# Patient Record
Sex: Male | Born: 1990 | Race: White | Hispanic: No | Marital: Single | Smoking: Never smoker
Health system: Southern US, Community
[De-identification: ages and names within clinical notes are randomized; demographics above are authoritative.]

## PROBLEM LIST (undated history)

## (undated) DIAGNOSIS — F329 Major depressive disorder, single episode, unspecified: Secondary | ICD-10-CM

## (undated) DIAGNOSIS — F419 Anxiety disorder, unspecified: Secondary | ICD-10-CM

## (undated) DIAGNOSIS — F32A Depression, unspecified: Secondary | ICD-10-CM

## (undated) HISTORY — PX: OTHER SURGICAL HISTORY: SHX169

## (undated) HISTORY — PX: WISDOM TOOTH EXTRACTION: SHX21

---
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
## 2015-07-08 ENCOUNTER — Encounter (HOSPITAL_COMMUNITY): Payer: Self-pay | Admitting: Emergency Medicine

## 2015-07-08 ENCOUNTER — Emergency Department (HOSPITAL_COMMUNITY)
Admission: EM | Admit: 2015-07-08 | Discharge: 2015-07-09 | Disposition: A | Discharge status: Home / Self Care | Payer: Worker's Compensation | Attending: Emergency Medicine | Admitting: Emergency Medicine

## 2015-07-08 ENCOUNTER — Emergency Department (HOSPITAL_COMMUNITY): Payer: Worker's Compensation

## 2015-07-08 DIAGNOSIS — Z79899 Other long term (current) drug therapy: Secondary | ICD-10-CM | POA: Diagnosis not present

## 2015-07-08 DIAGNOSIS — Z880 Allergy status to penicillin: Secondary | ICD-10-CM | POA: Diagnosis not present

## 2015-07-08 DIAGNOSIS — F329 Major depressive disorder, single episode, unspecified: Secondary | ICD-10-CM | POA: Insufficient documentation

## 2015-07-08 DIAGNOSIS — T59811A Toxic effect of smoke, accidental (unintentional), initial encounter: Secondary | ICD-10-CM

## 2015-07-08 DIAGNOSIS — F419 Anxiety disorder, unspecified: Secondary | ICD-10-CM | POA: Insufficient documentation

## 2015-07-08 DIAGNOSIS — J705 Respiratory conditions due to smoke inhalation: Secondary | ICD-10-CM | POA: Diagnosis not present

## 2015-07-08 HISTORY — DX: Anxiety disorder, unspecified: F41.9

## 2015-07-08 HISTORY — DX: Major depressive disorder, single episode, unspecified: F32.9

## 2015-07-08 HISTORY — DX: Depression, unspecified: F32.A

## 2015-07-08 NOTE — ED Notes (Signed)
Poison Control Recommendations: ABG Carboxyhemoglobin Level, EKG, cardiac monitoring, O2 @  100%  Watch for QRS widening, supportive care, ataxia, and neurologic changes
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
## 2015-07-09 LAB — CARBOXYHEMOGLOBIN
Carboxyhemoglobin: 0.7 % (ref 0.5–1.5)
Methemoglobin: 1.2 % (ref 0.0–1.5)
O2 Saturation: 95.5 %
TOTAL HEMOGLOBIN: 14.4 g/dL (ref 13.5–18.0)

## 2015-07-09 LAB — CK: Total CK: 530 U/L — ABNORMAL HIGH (ref 49–397)

## 2015-07-09 LAB — BLOOD GAS, ARTERIAL
ACID-BASE EXCESS: 1.1 mmol/L (ref 0.0–2.0)
Bicarbonate: 25.4 mEq/L — ABNORMAL HIGH (ref 20.0–24.0)
DRAWN BY: 11249
FIO2: 0.21
O2 SAT: 95.5 %
PCO2 ART: 42.1 mmHg (ref 35.0–45.0)
PO2 ART: 82.7 mmHg (ref 80.0–100.0)
Patient temperature: 98.9
TCO2: 26.7 mmol/L (ref 0–100)
pH, Arterial: 7.399 (ref 7.350–7.450)

## 2015-07-09 NOTE — Progress Notes (Signed)
ABG and COOX results are not in sunquest and flexilab due to machine difficulity. Results were given to Myrle ShengKelly Gekas,PA. ABG results are pH 7.39, pCO2 42.1, pO2 82.7, bicarb 25.4. COOX results are 0.7, 1.2,

## 2015-07-09 NOTE — Discharge Instructions (Signed)
Chemical Inhalation Injury  A chemical inhalation injury is an internal injury, such as lung damage, that results from breathing in fumes of a chemical or harmful substance (toxic agent). Chemical inhalation injuries most often occur:   During fires, when materials that are burned release chemicals into the environment.   During work accidents, when large quantities of toxic chemicals are spilled at factories or industrial sites.  Chemical inhalation injuries vary in severity. An injury tends to be more severe:   The more acidic or alkaline the chemical is.   The more concentrated the substance is.   The longer you are exposed to the substance.  RISK FACTORS  You are at a high risk for a chemical inhalation injury if you:   Are exposed to burning materials.   Work with chemicals, solvents, or cleaners.  SIGNS AND SYMPTOMS  Symptoms of a chemical inhalation injury may include:   Hoarse voice.   Shortness of breath or trouble breathing.   Chest pain.   Pale or blue skin.   Mucus production.   Cough.   Weakness.   Dizziness or fainting.  DIAGNOSIS  Most chemical inhalation injuries can be diagnosed with a physical exam and medical history. Tests may be done to check for lung damage. They may include:   A blood oxygen level test.   A chest X-ray.   Pulmonary function tests.  There are no tests to identify the specific chemical or substance that caused the injury.  TREATMENT   There is no specific treatment for a chemical inhalation injury. Most treatment is directed at improving the ability of the lungs to deliver oxygen to the body. Time is needed for lung tissue to heal. Supportive treatment may include:   Aerosol treatments to decrease swelling in the airways.   Suctioning of the airways to remove excess mucus.   Supplemental oxygen.  HOME CARE INSTRUCTIONS   Do not use any tobacco products, including cigarettes, chewing tobacco, or electronic cigarettes. If you need help quitting, ask your  health care provider.   Do not allow yourself to be exposed to any airway irritants, such as cigarette smoke or smoke from a fireplace.   Follow your health care provider's instructions for the use of any inhalers.   Take medicines only as directed by your health care provider.   Keep all follow-up visits as directed by your health care provider. This is important.  SEEK MEDICAL CARE IF:   Your symptoms are not improving as your health care provider predicted.  SEEK IMMEDIATE MEDICAL CARE IF:   Your symptoms get worse.   You have increasing shortness of breath or wheezing.   Your skin or your lips appear very pale or blue.   You have a persistent cough.   You cough up blood or dark material.   You have chest pain or weakness.   You have a fever.   You faint.     This information is not intended to replace advice given to you by your health care provider. Make sure you discuss any questions you have with your health care provider.     Document Released: 11/23/2013 Document Reviewed: 11/23/2013  Elsevier Interactive Patient Education 2016 [employer]

## 2016-08-11 ENCOUNTER — Encounter (HOSPITAL_COMMUNITY): Payer: Self-pay | Admitting: Emergency Medicine

## 2016-08-11 ENCOUNTER — Emergency Department (HOSPITAL_COMMUNITY): Payer: Worker's Compensation

## 2016-08-11 ENCOUNTER — Emergency Department (HOSPITAL_COMMUNITY)
Admission: EM | Admit: 2016-08-11 | Discharge: 2016-08-11 | Disposition: A | Discharge status: Home / Self Care | Payer: Worker's Compensation | Attending: Emergency Medicine | Admitting: Emergency Medicine

## 2016-08-11 DIAGNOSIS — Y939 Activity, unspecified: Secondary | ICD-10-CM | POA: Insufficient documentation

## 2016-08-11 DIAGNOSIS — R51 Headache: Secondary | ICD-10-CM | POA: Diagnosis not present

## 2016-08-11 DIAGNOSIS — Y92410 Unspecified street and highway as the place of occurrence of the external cause: Secondary | ICD-10-CM | POA: Insufficient documentation

## 2016-08-11 DIAGNOSIS — S161XXA Strain of muscle, fascia and tendon at neck level, initial encounter: Secondary | ICD-10-CM | POA: Insufficient documentation

## 2016-08-11 DIAGNOSIS — Y999 Unspecified external cause status: Secondary | ICD-10-CM | POA: Diagnosis not present

## 2016-08-11 DIAGNOSIS — S199XXA Unspecified injury of neck, initial encounter: Secondary | ICD-10-CM | POA: Diagnosis present

## 2016-08-11 MED ORDER — IBUPROFEN 800 MG PO TABS
800.0000 mg | ORAL_TABLET | Freq: Once | ORAL | Status: AC
  Administered 2016-08-11: 800 mg via ORAL
  Filled 2016-08-11: qty 1

## 2016-08-11 MED ORDER — METHOCARBAMOL 500 MG PO TABS
1000.0000 mg | ORAL_TABLET | Freq: Once | ORAL | Status: AC
  Administered 2016-08-11: 1000 mg via ORAL
  Filled 2016-08-11: qty 2

## 2016-08-11 MED ORDER — METHOCARBAMOL 500 MG PO TABS: 500.0000 mg | ORAL_TABLET | Freq: Three times a day (TID) | ORAL | 0 refills | Status: AC | PRN
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
